# Patient Record
Sex: Male | Born: 1995 | Hispanic: No | Marital: Single | Smoking: Never smoker
Health system: Southern US, Community
[De-identification: ages and names within clinical notes are randomized; demographics above are authoritative.]

## PROBLEM LIST (undated history)

## (undated) ENCOUNTER — Ambulatory Visit: Admission: EM | Payer: Managed Care, Other (non HMO) | Source: Home / Self Care

---

## 2018-06-09 ENCOUNTER — Ambulatory Visit (INDEPENDENT_AMBULATORY_CARE_PROVIDER_SITE_OTHER): Payer: Worker's Compensation

## 2018-06-09 ENCOUNTER — Encounter: Payer: Self-pay | Admitting: Emergency Medicine

## 2018-06-09 ENCOUNTER — Other Ambulatory Visit: Payer: Self-pay

## 2018-06-09 ENCOUNTER — Ambulatory Visit
Admission: EM | Admit: 2018-06-09 | Discharge: 2018-06-09 | Disposition: A | Discharge status: Home / Self Care | Payer: Worker's Compensation | Attending: Internal Medicine | Admitting: Internal Medicine

## 2018-06-09 DIAGNOSIS — S63501A Unspecified sprain of right wrist, initial encounter: Secondary | ICD-10-CM

## 2018-06-09 DIAGNOSIS — M25531 Pain in right wrist: Secondary | ICD-10-CM

## 2018-06-09 DIAGNOSIS — X500XXA Overexertion from strenuous movement or load, initial encounter: Secondary | ICD-10-CM | POA: Diagnosis not present

## 2018-06-09 NOTE — ED Provider Notes (Signed)
[facility]    CSN: [id number] Arrival date & time: 06/09/18  1957     History   Chief Complaint Chief Complaint  Patient presents with  . Wrist Injury    right (DOI 06/09/18)    HPI Shane Ortiz is a 22 y.o. male.   22 yo male c/o wrist pain starting today after lifting heavy water tank overhead.      History reviewed. No pertinent past medical history.  There are no active problems to display for this patient.   History reviewed. No pertinent surgical history.     Home Medications    Prior to Admission medications   Not on File    Family History Family History  Problem Relation Age of Onset  . Healthy Mother   . Diabetes Father     Social History Social History   Tobacco Use  . Smoking status: Never Smoker  . Smokeless tobacco: Never Used  Substance Use Topics  . Alcohol use: Never    Frequency: Never  . Drug use: Never     Allergies   Patient has no known allergies.   Review of Systems Review of Systems  Constitutional: Negative for chills and fever.  HENT: Negative for sore throat and tinnitus.   Eyes: Negative for redness.  Respiratory: Negative for cough and shortness of breath.   Cardiovascular: Negative for chest pain and palpitations.  Gastrointestinal: Negative for abdominal pain, diarrhea, nausea and vomiting.  Genitourinary: Negative for dysuria, frequency and urgency.  Musculoskeletal: Negative for myalgias.  Skin: Negative for rash.       No lesions  Neurological: Negative for weakness.  Hematological: Does not bruise/bleed easily.  Psychiatric/Behavioral: Negative for suicidal ideas.     Physical Exam Triage Vital Signs ED Triage Vitals  Enc Vitals Group     BP 06/09/18 2026 135/88     Pulse Rate 06/09/18 2026 90     Resp 06/09/18 2026 16     Temp 06/09/18 2026 98.7 F (37.1 C)     Temp Source 06/09/18 2026 Oral     SpO2 06/09/18 2026 100 %     Weight 06/09/18 2026 143 lb 6.4 oz (65 kg)     Height  06/09/18 2026 5\' 6"  (1.676 m)     Head Circumference --      Peak Flow --      Pain Score 06/09/18 2025 9     Pain Loc --      Pain Edu? --      Excl. in GC? --    No data found.  Updated Vital Signs BP 135/88 (BP Location: Left Arm)   Pulse 90   Temp 98.7 F (37.1 C) (Oral)   Resp 16   Ht 5\' 6"  (1.676 m)   Wt 143 lb 6.4 oz (65 kg)   SpO2 100%   BMI 23.15 kg/m   Visual Acuity Right Eye Distance:   Left Eye Distance:   Bilateral Distance:    Right Eye Near:   Left Eye Near:    Bilateral Near:     Physical Exam  Constitutional: He is oriented to person, place, and time. He appears well-developed and well-nourished. No distress.  HENT:  Head: Normocephalic and atraumatic.  Mouth/Throat: Oropharynx is clear and moist.  Eyes: Pupils are equal, round, and reactive to light. Conjunctivae and EOM are normal. No scleral icterus.  Neck: Normal range of motion. Neck supple. No JVD present. No tracheal deviation present. No thyromegaly present.  Cardiovascular: Normal rate, regular rhythm and normal heart sounds. Exam reveals no gallop and no friction rub.  No murmur heard. Pulmonary/Chest: Effort normal and breath sounds normal. No respiratory distress.  Abdominal: Soft. Bowel sounds are normal. He exhibits no distension. There is no tenderness.  Musculoskeletal: Normal range of motion. He exhibits tenderness (mild; circumferential no point pain). He exhibits no edema or deformity.  Lymphadenopathy:    He has no cervical adenopathy.  Neurological: He is alert and oriented to person, place, and time. No cranial nerve deficit.  Skin: Skin is warm and dry. No rash noted. No erythema.  Psychiatric: He has a normal mood and affect. His behavior is normal. Judgment and thought content normal.     UC Treatments / Results  Labs (all labs ordered are listed, but only abnormal results are displayed) Labs Reviewed - No data to display  EKG None  Radiology Dg Wrist Complete  Right  Result Date: 06/09/2018 CLINICAL DATA:  Patient in today stating that he was trying to lift something heavy at work and twisted his right wrist. EXAM: RIGHT WRIST - COMPLETE 3+ VIEW COMPARISON:  None. FINDINGS: There is no evidence of fracture or dislocation. There is no evidence of arthropathy or other focal bone abnormality. Soft tissues are unremarkable. IMPRESSION: Negative. Electronically Signed   By: Bary Richard M.D.   On: 06/09/2018 20:46    Procedures Procedures (including critical care time)  Medications Ordered in UC Medications - No data to display  Initial Impression / Assessment and Plan / UC Course  I have reviewed the triage vital signs and the nursing notes.  Pertinent labs & imaging results that were available during my care of the patient were reviewed by me and considered in my medical decision making (see chart for details).     No fx on xray. Splint wrist  Final Clinical Impressions(s) / UC Diagnoses   Final diagnoses:  Right wrist sprain, initial encounter   Discharge Instructions   None    ED Prescriptions    None     Controlled Substance Prescriptions [place] Controlled Substance Registry consulted? Not Applicable   Arnaldo Natal, MD 06/09/18 2113

## 2018-06-09 NOTE — ED Triage Notes (Signed)
Patient in today stating that he was trying to lift something heavy at work and twisted his right wrist.

## 2019-08-16 IMAGING — CR DG WRIST COMPLETE 3+V*R*
4 series · 4 of 4 positions shown · non-contrast
Comparison: None.

CLINICAL DATA: Patient in today stating that he was trying to lift
something heavy at work and twisted his right wrist.

EXAM:
RIGHT WRIST - COMPLETE 3+ VIEW

[wrist pa]
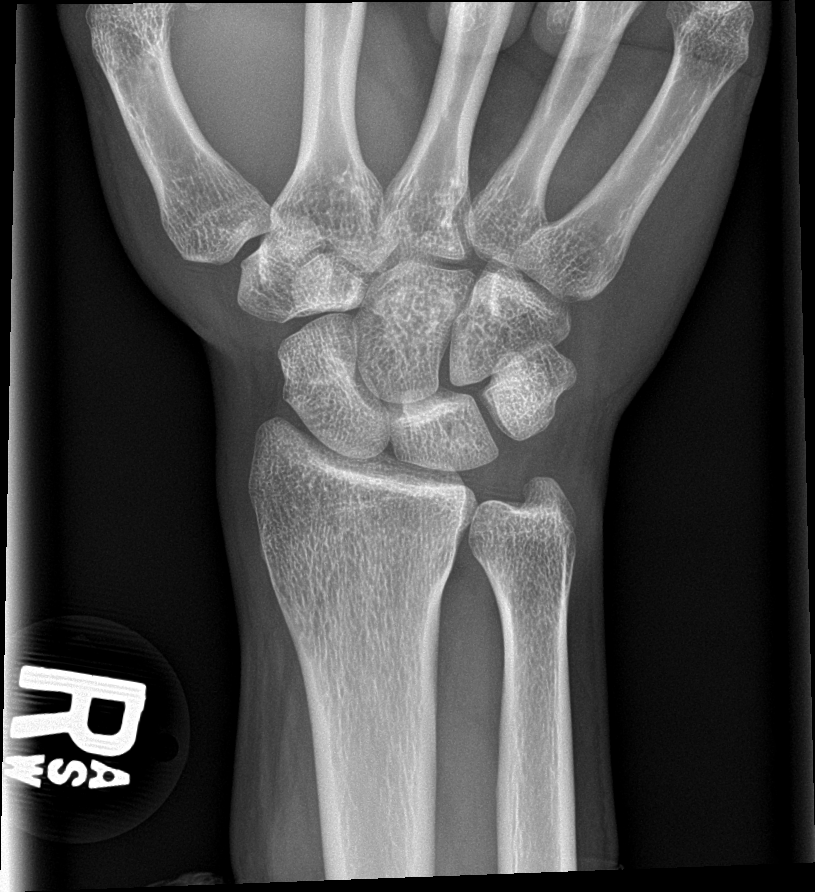

[wrist obl]
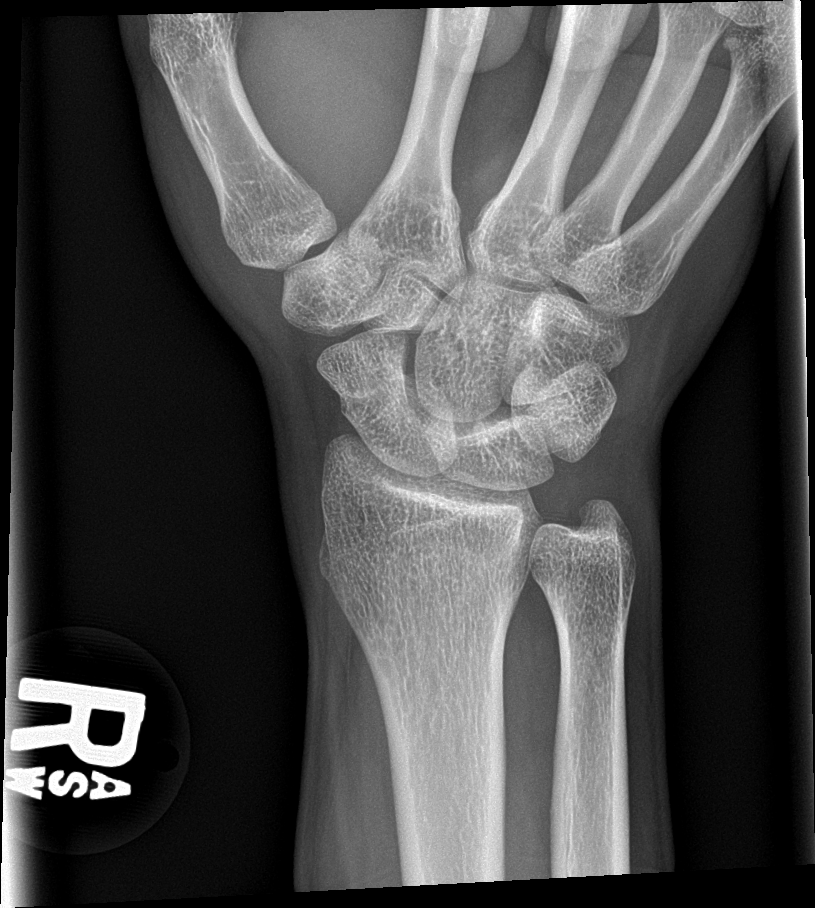

[wrist lat]
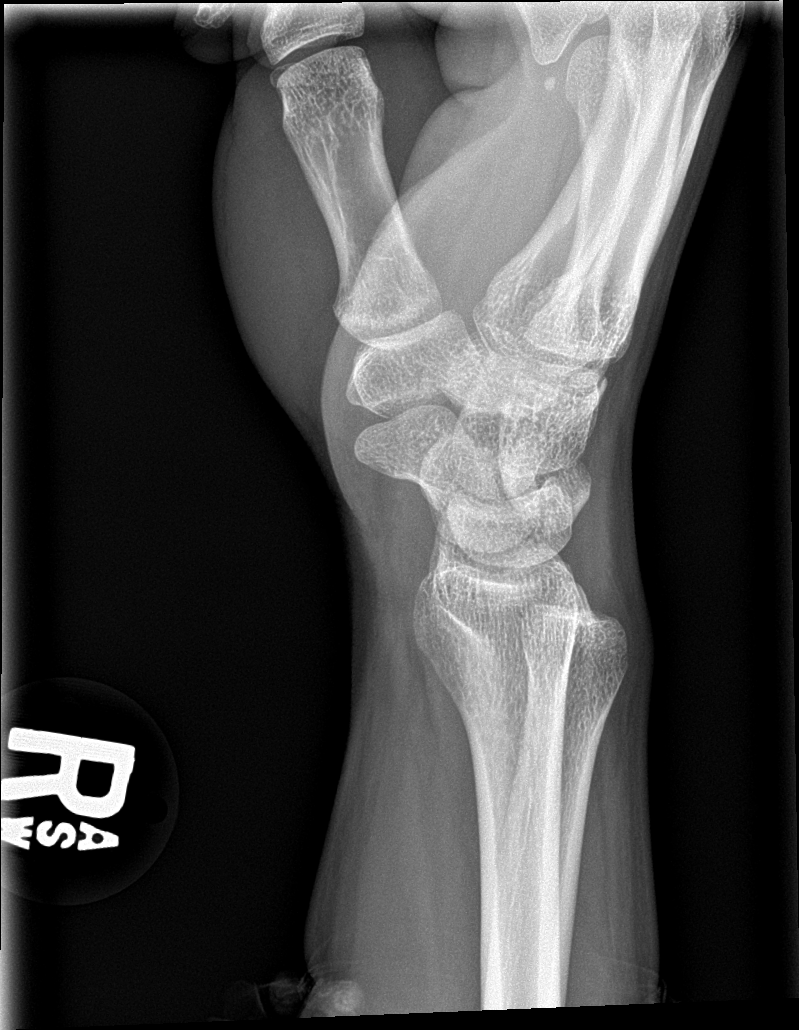

[wrist navicular]
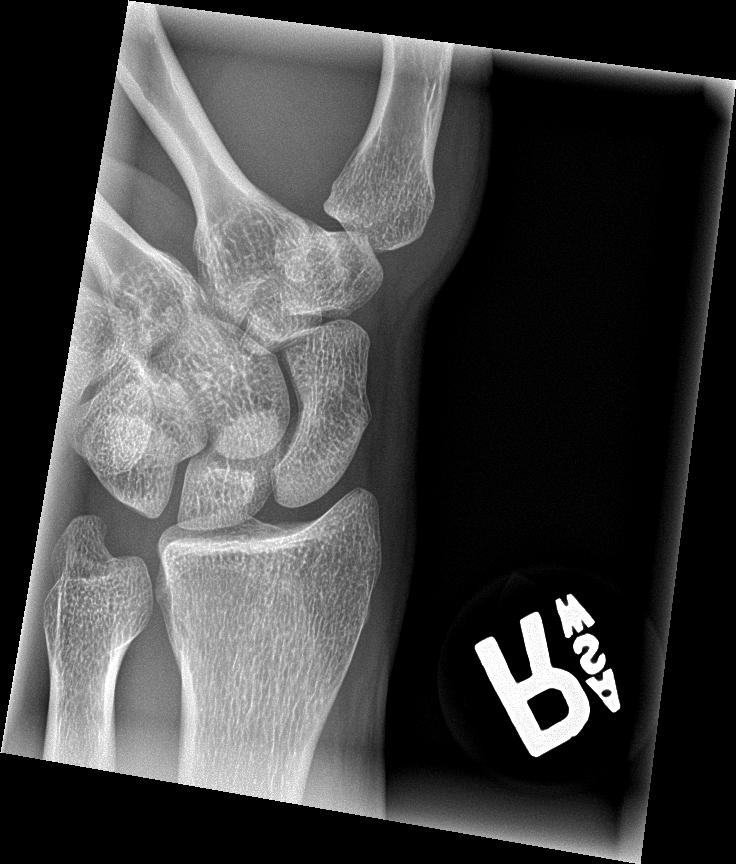

[4 of 4 positions shown; findings below may reference images not displayed]

FINDINGS: There is no evidence of fracture or dislocation. There is no
evidence of arthropathy or other focal bone abnormality. Soft
tissues are unremarkable.
IMPRESSION: Negative.

## 2021-10-12 ENCOUNTER — Ambulatory Visit
Admission: EM | Admit: 2021-10-12 | Discharge: 2021-10-12 | Disposition: A | Discharge status: Home / Self Care | Payer: Managed Care, Other (non HMO) | Attending: Internal Medicine | Admitting: Internal Medicine

## 2021-10-12 ENCOUNTER — Other Ambulatory Visit: Payer: Self-pay

## 2021-10-12 ENCOUNTER — Encounter: Payer: Self-pay | Admitting: Emergency Medicine

## 2021-10-12 DIAGNOSIS — K0889 Other specified disorders of teeth and supporting structures: Secondary | ICD-10-CM

## 2021-10-12 MED ORDER — IBUPROFEN 800 MG PO TABS: 800.0000 mg | ORAL_TABLET | Freq: Three times a day (TID) | ORAL | 0 refills | Status: DC

## 2021-10-12 MED ORDER — ACETAMINOPHEN 500 MG PO TABS
1000.0000 mg | ORAL_TABLET | Freq: Once | ORAL | Status: AC
  Administered 2021-10-12: 1000 mg via ORAL

## 2021-10-12 MED ORDER — PENICILLIN V POTASSIUM 500 MG PO TABS: 500.0000 mg | ORAL_TABLET | Freq: Four times a day (QID) | ORAL | 0 refills | Status: AC

## 2021-10-12 NOTE — Discharge Instructions (Addendum)
Alternate Tylenol with Ibuprofen like discussed.

## 2021-10-12 NOTE — ED Provider Notes (Signed)
[facility]    CSN: [id number] Arrival date & time: 10/12/21  1144      History   Chief Complaint Chief Complaint  Patient presents with   Dental Pain    HPI Shane Ortiz is a 25 y.o. male who presents with severe L upper molar pain which started last week but was not bad and lasted for a few days.Has a chipped molar where the pain is. Today in the middle of work felt the pain, and has not taken anything for this. He is supposed to see an oral surgeon for tooth extraction, but does not have an appointment yet. Took one dose of Amoxicillin from [place] his friend had today.    History reviewed. No pertinent past medical history.  There are no problems to display for this patient.   History reviewed. No pertinent surgical history.     Home Medications    Prior to Admission medications   Medication Sig Start Date End Date Taking? Authorizing Provider  ibuprofen (ADVIL) 800 MG tablet Take 1 tablet (800 mg total) by mouth 3 (three) times daily. 10/12/21  Yes Rodriguez-Southworth, Nettie Elm, PA-C  penicillin v potassium (VEETID) 500 MG tablet Take 1 tablet (500 mg total) by mouth 4 (four) times daily for 10 days. 10/12/21 10/22/21 Yes Rodriguez-Southworth, Nettie Elm, PA-C    Family History Family History  Problem Relation Age of Onset   Healthy Mother    Diabetes Father     Social History Social History   Tobacco Use   Smoking status: Never   Smokeless tobacco: Never  Vaping Use   Vaping Use: Never used  Substance Use Topics   Alcohol use: Never   Drug use: Never     Allergies   Patient has no known allergies.   Review of Systems Review of Systems  Constitutional:  Negative for fever.  HENT:  Positive for dental problem. Negative for facial swelling.   Musculoskeletal:  Negative for myalgias.  Neurological:  Negative for headaches.    Physical Exam Triage Vital Signs ED Triage Vitals  Enc Vitals Group     BP 10/12/21 1203 (!) 133/92     Pulse  Rate 10/12/21 1203 95     Resp 10/12/21 1203 16     Temp 10/12/21 1203 98.7 F (37.1 C)     Temp Source 10/12/21 1203 Oral     SpO2 10/12/21 1203 100 %     Weight 10/12/21 1201 143 lb 4.8 oz (65 kg)     Height 10/12/21 1201 5\' 7"  (1.702 m)     Head Circumference --      Peak Flow --      Pain Score 10/12/21 1201 5     Pain Loc --      Pain Edu? --      Excl. in GC? --    No data found.  Updated Vital Signs BP (!) 133/92 (BP Location: Left Arm)   Pulse 95   Temp 98.7 F (37.1 C) (Oral)   Resp 16   Ht 5\' 7"  (1.702 m)   Wt 143 lb 4.8 oz (65 kg)   SpO2 100%   BMI 22.44 kg/m   Visual Acuity Right Eye Distance:   Left Eye Distance:   Bilateral Distance:    Right Eye Near:   Left Eye Near:    Bilateral Near:     Physical Exam Vitals and nursing note reviewed.  Constitutional:      General: He is in acute distress.  Appearance: He is not toxic-appearing.     Comments: Who is in moderate pain  HENT:     Right Ear: External ear normal.     Left Ear: External ear normal.     Mouth/Throat:      Comments: Has chipped left upper posterior molar which is bleeding and the gum around it is swollen and red. I do not see an abscess.  Eyes:     General: No scleral icterus.    Conjunctiva/sclera: Conjunctivae normal.  Pulmonary:     Effort: Pulmonary effort is normal.  Musculoskeletal:        General: Normal range of motion.     Cervical back: Neck supple.  Lymphadenopathy:     Cervical: No cervical adenopathy.  Skin:    General: Skin is warm and dry.  Neurological:     Mental Status: He is alert.  Psychiatric:        Mood and Affect: Mood normal.        Behavior: Behavior normal.        Thought Content: Thought content normal.        Judgment: Judgment normal.     UC Treatments / Results  Labs (all labs ordered are listed, but only abnormal results are displayed) Labs Reviewed - No data to display  EKG   Radiology No results  found.  Procedures Procedures (including critical care time)  Medications Ordered in UC Medications  acetaminophen (TYLENOL) tablet 1,000 mg (1,000 mg Oral Given 10/12/21 1325)    Initial Impression / Assessment and Plan / UC Course  I have reviewed the triage vital signs and the nursing notes. Dental pain with possible infection He was given Tylenol 1000 mg PO here.  I placed him on Penicillin and Ibuprofen as noted. He was taught how to alternate the Tylenol q 6h with the Ibuprofen q 8h.  Final Clinical Impressions(s) / UC Diagnoses   Final diagnoses:  Pain, dental     Discharge Instructions      Alternate Tylenol with Ibuprofen like discussed.     ED Prescriptions     Medication Sig Dispense Auth. Provider   penicillin v potassium (VEETID) 500 MG tablet Take 1 tablet (500 mg total) by mouth 4 (four) times daily for 10 days. 40 tablet Rodriguez-Southworth, Nettie Elm, PA-C   ibuprofen (ADVIL) 800 MG tablet Take 1 tablet (800 mg total) by mouth 3 (three) times daily. 21 tablet Rodriguez-Southworth, Nettie Elm, PA-C      I have reviewed the PDMP during this encounter.   Garey Ham, PA-C 10/12/21 1332

## 2021-10-12 NOTE — ED Triage Notes (Signed)
Patient c/o left upper tooth pain that started 2 days ago.  Patient denies fevers.

## 2021-11-08 ENCOUNTER — Other Ambulatory Visit: Payer: Self-pay

## 2022-02-13 ENCOUNTER — Ambulatory Visit
Admission: EM | Admit: 2022-02-13 | Discharge: 2022-02-13 | Disposition: A | Discharge status: Home / Self Care | Payer: Managed Care, Other (non HMO) | Attending: Family | Admitting: Family

## 2022-02-13 DIAGNOSIS — H02823 Cysts of right eye, unspecified eyelid: Secondary | ICD-10-CM

## 2022-02-13 MED ORDER — CEPHALEXIN 500 MG PO CAPS: 500.0000 mg | ORAL_CAPSULE | Freq: Three times a day (TID) | ORAL | 0 refills | Status: AC

## 2022-02-13 NOTE — ED Triage Notes (Signed)
Pt reports having a bump in the right upper eyelid x 3 weeks; blurry vision started today.Denies pain. ?

## 2022-02-13 NOTE — Discharge Instructions (Addendum)
Recommend start Keflex antibiotic 500mg  capsule 3 times a day with food for 7 days. May apply warm compresses to area at least 2 times a day. Follow-up in 5 to 7 days if not improving or sooner if worsening.  ?

## 2022-02-13 NOTE — ED Provider Notes (Signed)
?[facility] ? ? ? ?CSN: [id number] ?Arrival date & time: 02/13/22  1715 ? ? ?  ? ?History   ?Chief Complaint ?Chief Complaint  ?Patient presents with  ? Eye Problem  ? ? ?HPI ?Shane ClaymanBrian Pompei is a 26 y.o. male.  ? ?26 year old male presents with "bump" on his right upper mid eyelid for the past 3 weeks. Has slowly been getting larger and today has caused his eye to tear and slight blurred vision. Denies any distinct pain or redness in area. No lesions or bumps on left eyelid. Denies any fever, nasal congestion, runny nose or cough. Has not taken any medication or applied any topical medication to area. Has history of previous stye but no other lesions. No other chronic health issues. Takes no daily medication.  ? ?The history is provided by the patient.  ? ?History reviewed. No pertinent past medical history. ? ?There are no problems to display for this patient. ? ? ?History reviewed. No pertinent surgical history. ? ? ? ? ?Home Medications   ? ?Prior to Admission medications   ?Medication Sig Start Date End Date Taking? Authorizing Provider  ?cephALEXin (KEFLEX) 500 MG capsule Take 1 capsule (500 mg total) by mouth 3 (three) times daily for 7 days. 02/13/22 02/20/22 Yes Shanaye Rief, Ali LoweAnn Berry, NP  ? ? ?Family History ?Family History  ?Problem Relation Age of Onset  ? Healthy Mother   ? Diabetes Father   ? ? ?Social History ?Social History  ? ?Tobacco Use  ? Smoking status: Never  ? Smokeless tobacco: Never  ?Vaping Use  ? Vaping Use: Never used  ?Substance Use Topics  ? Alcohol use: Never  ? Drug use: Never  ? ? ? ?Allergies   ?Patient has no known allergies. ? ? ?Review of Systems ?Review of Systems  ?Constitutional:  Negative for activity change, appetite change, chills, diaphoresis, fatigue and fever.  ?HENT:  Positive for facial swelling (right upper eyelid). Negative for congestion, ear discharge, ear pain, mouth sores, postnasal drip, rhinorrhea, sinus pressure, sinus pain, sneezing, sore throat and trouble  swallowing.   ?Eyes:  Positive for discharge (clear- right eye) and visual disturbance (slight blurred vision). Negative for photophobia, pain, redness and itching.  ?Respiratory:  Negative for cough and chest tightness.   ?Gastrointestinal:  Negative for nausea and vomiting.  ?Musculoskeletal:  Negative for arthralgias, myalgias, neck pain and neck stiffness.  ?Skin:  Negative for color change and rash.  ?Allergic/Immunologic: Negative for environmental allergies, food allergies and immunocompromised state.  ?Neurological:  Negative for dizziness, tremors, seizures, syncope, weakness, light-headedness, numbness and headaches.  ?Hematological:  Negative for adenopathy. Does not bruise/bleed easily.  ? ? ?Physical Exam ?Triage Vital Signs ?ED Triage Vitals  ?Enc Vitals Group  ?   BP 02/13/22 1824 129/67  ?   Pulse Rate 02/13/22 1824 90  ?   Resp 02/13/22 1824 16  ?   Temp 02/13/22 1824 98.5 ?F (36.9 ?C)  ?   Temp Source 02/13/22 1824 Oral  ?   SpO2 02/13/22 1824 99 %  ?   Weight --   ?   Height --   ?   Head Circumference --   ?   Peak Flow --   ?   Pain Score 02/13/22 1828 0  ?   Pain Loc --   ?   Pain Edu? --   ?   Excl. in GC? --   ? ?No data found. ? ?Updated Vital Signs ?BP 129/67 (  BP Location: Left Arm)   Pulse 90   Temp 98.5 ?F (36.9 ?C) (Oral)   Resp 16   SpO2 99%  ? ?Visual Acuity ?Right Eye Distance: 20/30 (Without correction) ?Left Eye Distance: 20/25 (Without correction) ?Bilateral Distance: 20/25 (Without correction) ? ?Right Eye Near:   ?Left Eye Near:    ?Bilateral Near:    ? ?Physical Exam ?Vitals and nursing note reviewed.  ?Constitutional:   ?   General: He is awake. He is not in acute distress. ?   Appearance: He is well-developed and well-groomed.  ?   Comments: He is sitting on the exam table in no acute distress. Denies any pain.   ?HENT:  ?   Head: Normocephalic and atraumatic.  ?   Right Ear: Hearing, tympanic membrane, ear canal and external ear normal.  ?   Left Ear: Hearing, tympanic  membrane, ear canal and external ear normal.  ?   Nose: Nose normal.  ?   Right Sinus: No maxillary sinus tenderness or frontal sinus tenderness.  ?   Left Sinus: No maxillary sinus tenderness or frontal sinus tenderness.  ?   Mouth/Throat:  ?   Lips: Pink.  ?   Mouth: Mucous membranes are moist.  ?   Pharynx: Oropharynx is clear.  ?Eyes:  ?   General: Vision grossly intact. No visual field deficit.    ?   Right eye: Discharge (clear) present. No hordeolum.     ?   Left eye: No discharge or hordeolum.  ?   Extraocular Movements: Extraocular movements intact.  ?   Conjunctiva/sclera: Conjunctivae normal.  ?   Pupils: Pupils are equal, round, and reactive to light.  ? ?   Comments: 2 to 37mm round raised cyst soft fluid-filled freely mobile cyst present on right upper central eyelid. Non-tender. No discharge expressed. Conjunctiva is normal. No redness. Slight clear discharge present. Inverted right upper eyelid- cyst does not extend into right upper conjunctiva. No distinct stye present. No bleeding.   ?Cardiovascular:  ?   Rate and Rhythm: Normal rate.  ?Pulmonary:  ?   Effort: Pulmonary effort is normal.  ?Musculoskeletal:  ?   Cervical back: Normal range of motion and neck supple.  ?Lymphadenopathy:  ?   Cervical: No cervical adenopathy.  ?Skin: ?   General: Skin is warm and dry.  ?   Findings: Lesion present. No bruising, erythema or rash.  ?Neurological:  ?   General: No focal deficit present.  ?   Mental Status: He is alert and oriented to person, place, and time.  ?Psychiatric:     ?   Attention and Perception: Attention normal.     ?   Mood and Affect: Mood is anxious.     ?   Speech: Speech normal.     ?   Behavior: Behavior is cooperative.     ?   Thought Content: Thought content normal.  ? ? ?UC Treatments / Results  ?Labs ?(all labs ordered are listed, but only abnormal results are displayed) ?Labs Reviewed - No data to display ? ?EKG ? ? ?Radiology ?No results found. ? ?Procedures ?Procedures (including  critical care time) ? ?Medications Ordered in UC ?Medications - No data to display ? ?Initial Impression / Assessment and Plan / UC Course  ?I have reviewed the triage vital signs and the nursing notes. ? ?Pertinent labs & imaging results that were available during my care of the patient were reviewed by me and considered in my  medical decision making (see chart for details). ? ?  ? ?Discussed with patient that he appears to have a cyst on his right upper eyelid in epidermal space. Since it is getting larger, will treat for possible bacterial etiology. May start Keflex 500mg  3 times a day with food. Apply warm compresses to area for comfort. Reviewed that tearing of right eye and slight blurry vision may be due to pressure from cyst and continue to monitor. Discussed that he may need an Eye doctor or Dermatologist to remove cyst if it persists. Patient had multiple questions about timing of antibiotics and food intake. Reviewed antibiotic dosing after eating a meal to minimize GI side effects. Recommend follow-up here in 5 to 7 days if not improving or sooner if worsening.  ?Final Clinical Impressions(s) / UC Diagnoses  ? ?Final diagnoses:  ?Cyst, eyelid, right  ? ? ? ?Discharge Instructions   ? ?  ?Recommend start Keflex antibiotic 500mg  capsule 3 times a day with food for 7 days. May apply warm compresses to area at least 2 times a day. Follow-up in 5 to 7 days if not improving or sooner if worsening.  ? ? ? ?ED Prescriptions   ? ? Medication Sig Dispense Auth. Provider  ? cephALEXin (KEFLEX) 500 MG capsule Take 1 capsule (500 mg total) by mouth 3 (three) times daily for 7 days. 21 capsule , NP  ? ?  ? ?PDMP not reviewed this encounter. ?  ? , NP ?02/14/22 1056 ? ?

## 2022-05-03 DIAGNOSIS — M25519 Pain in unspecified shoulder: Secondary | ICD-10-CM | POA: Diagnosis not present

## 2022-05-03 DIAGNOSIS — Z23 Encounter for immunization: Secondary | ICD-10-CM | POA: Diagnosis not present

## 2022-05-03 DIAGNOSIS — S299XXA Unspecified injury of thorax, initial encounter: Secondary | ICD-10-CM | POA: Diagnosis not present

## 2022-05-03 DIAGNOSIS — R52 Pain, unspecified: Secondary | ICD-10-CM | POA: Diagnosis not present

## 2022-05-03 DIAGNOSIS — M79645 Pain in left finger(s): Secondary | ICD-10-CM | POA: Diagnosis not present

## 2022-05-03 DIAGNOSIS — M25561 Pain in right knee: Secondary | ICD-10-CM | POA: Diagnosis not present

## 2022-05-03 DIAGNOSIS — M79642 Pain in left hand: Secondary | ICD-10-CM | POA: Diagnosis not present

## 2022-05-03 DIAGNOSIS — M25511 Pain in right shoulder: Secondary | ICD-10-CM | POA: Diagnosis not present

## 2022-05-03 DIAGNOSIS — S80812A Abrasion, left lower leg, initial encounter: Secondary | ICD-10-CM | POA: Diagnosis not present

## 2022-05-03 DIAGNOSIS — S99922A Unspecified injury of left foot, initial encounter: Secondary | ICD-10-CM | POA: Diagnosis not present

## 2022-05-03 DIAGNOSIS — Y33XXXA Other specified events, undetermined intent, initial encounter: Secondary | ICD-10-CM | POA: Diagnosis not present

## 2022-05-03 DIAGNOSIS — S99912A Unspecified injury of left ankle, initial encounter: Secondary | ICD-10-CM | POA: Diagnosis not present

## 2022-05-03 DIAGNOSIS — S80811A Abrasion, right lower leg, initial encounter: Secondary | ICD-10-CM | POA: Diagnosis not present

## 2022-05-03 DIAGNOSIS — S8992XA Unspecified injury of left lower leg, initial encounter: Secondary | ICD-10-CM | POA: Diagnosis not present

## 2022-05-03 DIAGNOSIS — M25572 Pain in left ankle and joints of left foot: Secondary | ICD-10-CM | POA: Diagnosis not present

## 2022-05-03 DIAGNOSIS — W2210XA Striking against or struck by unspecified automobile airbag, initial encounter: Secondary | ICD-10-CM | POA: Diagnosis not present

## 2022-05-09 ENCOUNTER — Encounter: Payer: Self-pay | Admitting: Emergency Medicine

## 2022-05-09 ENCOUNTER — Ambulatory Visit
Admission: EM | Admit: 2022-05-09 | Discharge: 2022-05-09 | Disposition: A | Discharge status: Home / Self Care | Payer: BC Managed Care – PPO | Attending: Emergency Medicine | Admitting: Emergency Medicine

## 2022-05-09 DIAGNOSIS — M25511 Pain in right shoulder: Secondary | ICD-10-CM | POA: Diagnosis not present

## 2022-05-09 DIAGNOSIS — S81812A Laceration without foreign body, left lower leg, initial encounter: Secondary | ICD-10-CM | POA: Diagnosis not present

## 2022-05-09 MED ORDER — CEPHALEXIN 500 MG PO CAPS: 1000.0000 mg | ORAL_CAPSULE | Freq: Two times a day (BID) | ORAL | 0 refills | Status: AC

## 2022-05-09 MED ORDER — METHOCARBAMOL 500 MG PO TABS: 500.0000 mg | ORAL_TABLET | Freq: Four times a day (QID) | ORAL | 0 refills | Status: AC

## 2022-05-19 ENCOUNTER — Ambulatory Visit (INDEPENDENT_AMBULATORY_CARE_PROVIDER_SITE_OTHER): Payer: BC Managed Care – PPO | Admitting: Family Medicine

## 2022-05-19 ENCOUNTER — Ambulatory Visit
Admission: EM | Admit: 2022-05-19 | Discharge: 2022-05-19 | Disposition: A | Discharge status: Home / Self Care | Payer: BC Managed Care – PPO

## 2022-05-19 ENCOUNTER — Encounter: Payer: Self-pay | Admitting: Family Medicine

## 2022-05-19 VITALS — BP 122/82 | HR 74 | Ht 67.0 in | Wt 160.0 lb

## 2022-05-19 DIAGNOSIS — M25511 Pain in right shoulder: Secondary | ICD-10-CM | POA: Diagnosis not present

## 2022-05-19 DIAGNOSIS — S46001A Unspecified injury of muscle(s) and tendon(s) of the rotator cuff of right shoulder, initial encounter: Secondary | ICD-10-CM | POA: Diagnosis not present

## 2022-05-19 DIAGNOSIS — M25572 Pain in left ankle and joints of left foot: Secondary | ICD-10-CM

## 2022-05-19 DIAGNOSIS — M76822 Posterior tibial tendinitis, left leg: Secondary | ICD-10-CM

## 2022-05-19 MED ORDER — PREDNISONE 50 MG PO TABS: 50.0000 mg | ORAL_TABLET | Freq: Every day | ORAL | 0 refills | Status: DC

## 2022-05-19 MED ORDER — MELOXICAM 15 MG PO TABS: 15.0000 mg | ORAL_TABLET | Freq: Every day | ORAL | 0 refills | Status: DC
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
## 2022-05-19 NOTE — Assessment & Plan Note (Signed)
See additional assessment(s) for history details.  Examination reveals multiple abrasions in various states of healing without open lesions, range of motion is limited by stiffness though he is able to actively provide strength in all directions, tenderness along the posterior tibialis tendons at the ankle tracing towards the medial aspect of the foot, pain with resisted testing, negative talar tilt, negative drawer testing, negative external rotation.  Symptoms are most consistent with posterior tibialis tendinitis, acute and traumatic in nature, no overt laxity and deltoid remains intact.  Plan for scheduled prednisone, then meloxicam,.  Methocarbamol, formal PT, he can continue to use a lace up ASO brace, and maintain close follow-up in 3 weeks.  Out of work restrictions provided.

## 2022-05-19 NOTE — Patient Instructions (Signed)
-   Take prednisone for full course - After prednisone complete, take meloxicam once daily - Use methocarbamol every 6 hours as-needed for muscle pain - Can use Tylenol, ice, heat for additional pain control - Start physical therapy, contact below for scheduling: [facility]:  [place]:  [phone]  - Remain out of work until follow-up - Return in 3 weeks
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
## 2022-05-19 NOTE — Discharge Instructions (Addendum)
-  I have gotten an appoint with Dr. Ashley Royalty today.  [facility] at [facility], [address], [address]. Phone number: [phone] Dr. Joseph Berkshire. Appointment at 4 pm. Arrive 15 min early.  -You have continued pain but your range of motion is significantly improved.  You may need further restrictions or possibly referral to physical therapy, potentially more imaging of your shoulder, and to be eased back into work.  Dr. Ashley Royalty to be better suited to help you with this then we can in urgent care.

## 2022-05-22 ENCOUNTER — Other Ambulatory Visit: Payer: Self-pay | Admitting: Family Medicine

## 2022-05-22 ENCOUNTER — Telehealth: Payer: Self-pay

## 2022-05-22 ENCOUNTER — Ambulatory Visit: Payer: BC Managed Care – PPO | Attending: Family Medicine | Admitting: Physical Therapy

## 2022-05-22 DIAGNOSIS — M6281 Muscle weakness (generalized): Secondary | ICD-10-CM | POA: Insufficient documentation

## 2022-05-22 DIAGNOSIS — S46001A Unspecified injury of muscle(s) and tendon(s) of the rotator cuff of right shoulder, initial encounter: Secondary | ICD-10-CM | POA: Insufficient documentation

## 2022-05-22 DIAGNOSIS — M25572 Pain in left ankle and joints of left foot: Secondary | ICD-10-CM | POA: Diagnosis not present

## 2022-05-22 DIAGNOSIS — R262 Difficulty in walking, not elsewhere classified: Secondary | ICD-10-CM | POA: Insufficient documentation

## 2022-05-22 DIAGNOSIS — M76822 Posterior tibial tendinitis, left leg: Secondary | ICD-10-CM | POA: Diagnosis not present

## 2022-05-22 DIAGNOSIS — M25511 Pain in right shoulder: Secondary | ICD-10-CM | POA: Diagnosis not present

## 2022-05-22 NOTE — Telephone Encounter (Signed)
Copied from CRM [id number]. Topic: General - Inquiry >> May 22, 2022  7:53 AM De Blanch wrote: Reason for CRM: Rayfield Citizen from [facility] stated pt went in this morning for PT, which was sent by Dr. Joseph Berkshire; however, they have no orders.  Please advise.  Call back - [phone]  Fax- [phone]
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
## 2022-05-22 NOTE — Telephone Encounter (Signed)
Dr Ashley Royalty put PT order in 05/22/2022- Silva Bandy

## 2022-05-25 ENCOUNTER — Encounter: Payer: Self-pay | Admitting: Physical Therapy

## 2022-05-26 ENCOUNTER — Ambulatory Visit: Payer: BC Managed Care – PPO | Admitting: Physical Therapy

## 2022-05-26 DIAGNOSIS — M76822 Posterior tibial tendinitis, left leg: Secondary | ICD-10-CM | POA: Diagnosis not present

## 2022-05-26 DIAGNOSIS — S46001A Unspecified injury of muscle(s) and tendon(s) of the rotator cuff of right shoulder, initial encounter: Secondary | ICD-10-CM | POA: Diagnosis not present

## 2022-05-26 DIAGNOSIS — R262 Difficulty in walking, not elsewhere classified: Secondary | ICD-10-CM | POA: Diagnosis not present

## 2022-05-26 DIAGNOSIS — M6281 Muscle weakness (generalized): Secondary | ICD-10-CM | POA: Diagnosis not present

## 2022-05-26 DIAGNOSIS — M25572 Pain in left ankle and joints of left foot: Secondary | ICD-10-CM | POA: Diagnosis not present

## 2022-05-26 DIAGNOSIS — M25511 Pain in right shoulder: Secondary | ICD-10-CM | POA: Diagnosis not present
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
## 2022-05-28 ENCOUNTER — Encounter: Payer: Self-pay | Admitting: Physical Therapy

## 2022-05-28 ENCOUNTER — Ambulatory Visit: Payer: BC Managed Care – PPO | Admitting: Physical Therapy

## 2022-05-28 DIAGNOSIS — M6281 Muscle weakness (generalized): Secondary | ICD-10-CM | POA: Diagnosis not present

## 2022-05-28 DIAGNOSIS — S46001A Unspecified injury of muscle(s) and tendon(s) of the rotator cuff of right shoulder, initial encounter: Secondary | ICD-10-CM | POA: Diagnosis not present

## 2022-05-28 DIAGNOSIS — M25572 Pain in left ankle and joints of left foot: Secondary | ICD-10-CM

## 2022-05-28 DIAGNOSIS — R262 Difficulty in walking, not elsewhere classified: Secondary | ICD-10-CM

## 2022-05-28 DIAGNOSIS — M25511 Pain in right shoulder: Secondary | ICD-10-CM

## 2022-05-28 DIAGNOSIS — M76822 Posterior tibial tendinitis, left leg: Secondary | ICD-10-CM | POA: Diagnosis not present
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
## 2022-06-02 ENCOUNTER — Ambulatory Visit: Payer: BC Managed Care – PPO | Admitting: Physical Therapy

## 2022-06-02 ENCOUNTER — Encounter: Payer: Self-pay | Admitting: Physical Therapy

## 2022-06-02 DIAGNOSIS — S46001A Unspecified injury of muscle(s) and tendon(s) of the rotator cuff of right shoulder, initial encounter: Secondary | ICD-10-CM | POA: Diagnosis not present

## 2022-06-02 DIAGNOSIS — M76822 Posterior tibial tendinitis, left leg: Secondary | ICD-10-CM | POA: Diagnosis not present

## 2022-06-02 DIAGNOSIS — R262 Difficulty in walking, not elsewhere classified: Secondary | ICD-10-CM

## 2022-06-02 DIAGNOSIS — M25572 Pain in left ankle and joints of left foot: Secondary | ICD-10-CM | POA: Diagnosis not present

## 2022-06-02 DIAGNOSIS — M6281 Muscle weakness (generalized): Secondary | ICD-10-CM

## 2022-06-02 DIAGNOSIS — M25511 Pain in right shoulder: Secondary | ICD-10-CM | POA: Diagnosis not present
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
## 2022-06-04 ENCOUNTER — Encounter: Payer: Self-pay | Admitting: Physical Therapy

## 2022-06-04 ENCOUNTER — Ambulatory Visit: Payer: BC Managed Care – PPO | Admitting: Physical Therapy

## 2022-06-04 DIAGNOSIS — M6281 Muscle weakness (generalized): Secondary | ICD-10-CM | POA: Diagnosis not present

## 2022-06-04 DIAGNOSIS — S46001A Unspecified injury of muscle(s) and tendon(s) of the rotator cuff of right shoulder, initial encounter: Secondary | ICD-10-CM | POA: Diagnosis not present

## 2022-06-04 DIAGNOSIS — M76822 Posterior tibial tendinitis, left leg: Secondary | ICD-10-CM | POA: Diagnosis not present

## 2022-06-04 DIAGNOSIS — M25572 Pain in left ankle and joints of left foot: Secondary | ICD-10-CM | POA: Diagnosis not present

## 2022-06-04 DIAGNOSIS — M25511 Pain in right shoulder: Secondary | ICD-10-CM

## 2022-06-04 DIAGNOSIS — R262 Difficulty in walking, not elsewhere classified: Secondary | ICD-10-CM
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
## 2022-06-06 ENCOUNTER — Ambulatory Visit (INDEPENDENT_AMBULATORY_CARE_PROVIDER_SITE_OTHER): Payer: BC Managed Care – PPO | Admitting: Family Medicine

## 2022-06-06 ENCOUNTER — Encounter: Payer: Self-pay | Admitting: Family Medicine

## 2022-06-06 DIAGNOSIS — M76822 Posterior tibial tendinitis, left leg: Secondary | ICD-10-CM

## 2022-06-06 DIAGNOSIS — S46001D Unspecified injury of muscle(s) and tendon(s) of the rotator cuff of right shoulder, subsequent encounter: Secondary | ICD-10-CM

## 2022-06-06 NOTE — Progress Notes (Signed)
     Primary Care / Sports Medicine Office Visit  Patient Information:  Patient ID: Shane Ortiz, male DOB: Oct 18, 1996 Age: 26 y.o. MRN: [id number]   Shane Ortiz is a pleasant 26 y.o. male presenting with the following:  Chief Complaint  Patient presents with   Injury of tendon of right rotator cuff    Is still taking Meloxicam, finished prednisone. States he is feeling better today.     Vitals:   06/06/22 1013  BP: 120/80  Pulse: 88  SpO2: 98%   Vitals:   06/06/22 1013  Weight: 160 lb (72.6 kg)  Height: 5\' 7"  (1.702 m)   Body mass index is 25.06 kg/m.  No results found.   Independent interpretation of notes and tests performed by another provider:   None  Procedures performed:   None  Pertinent History, Exam, Impression, and Recommendations:   Problem List Items Addressed This Visit       Musculoskeletal and Integument   Injury of tendon of right rotator cuff    Patient is demonstrated excellent interval progress following prednisone, meloxicam, physical therapy, and out of work restrictions.  His examination today reveals full painless range of motion, 5/5 rotator cuff strength, negative impingement, negative Hawkins, negative speeds, negative Yergason's, negative O'Brien's, minimal discomfort with isolated supraspinatus testing.  Given his excellent findings today, I have advised continued remaining formal PT sessions, home-based rehab, as needed meloxicam, and return to work in 1 week.  He can follow-up on in 1 month to assess his symptoms after returning to work.      Posterior tibial tendinitis, left    Patient presents for follow-up to left posterior tibialis tendinitis in setting of trauma, since last visit he has noted improvement though not resolution of symptoms, did dose prednisone, meloxicam, participated with formal PT, and has been using a ASO brace with benefit.  Examination today reveals limited range of motion due to deconditioning/stiffness, pain with  isolated posterior tibialis stressing, secondary tenderness throughout the dorsiflexors at the anterior ankle and dorsum of the midfoot.  At this stage of advised patient to gradually wean and ultimately discontinue ASO, heavy focus on stabilization and proprioceptive training through PT, continue meloxicam as needed, and return to work in 1 week.  When he returns to work he is to use his brace for 1-2 weeks, restart meloxicam for the same duration, and work restrictions advised to be at work note.  Return for follow-up in 1 month to assess symptoms following return to work.        Orders & Medications No orders of the defined types were placed in this encounter.  No orders of the defined types were placed in this encounter.    Return in about 4 weeks (around 07/04/2022).     07/06/2022, MD   Primary Care Sports Medicine [facility]

## 2022-06-06 NOTE — Assessment & Plan Note (Signed)
Patient is demonstrated excellent interval progress following prednisone, meloxicam, physical therapy, and out of work restrictions.  His examination today reveals full painless range of motion, 5/5 rotator cuff strength, negative impingement, negative Hawkins, negative speeds, negative Yergason's, negative O'Brien's, minimal discomfort with isolated supraspinatus testing.  Given his excellent findings today, I have advised continued remaining formal PT sessions, home-based rehab, as needed meloxicam, and return to work in 1 week.  He can follow-up on in 1 month to assess his symptoms after returning to work.

## 2022-06-06 NOTE — Assessment & Plan Note (Signed)
Patient presents for follow-up to left posterior tibialis tendinitis in setting of trauma, since last visit he has noted improvement though not resolution of symptoms, did dose prednisone, meloxicam, participated with formal PT, and has been using a ASO brace with benefit.  Examination today reveals limited range of motion due to deconditioning/stiffness, pain with isolated posterior tibialis stressing, secondary tenderness throughout the dorsiflexors at the anterior ankle and dorsum of the midfoot.  At this stage of advised patient to gradually wean and ultimately discontinue ASO, heavy focus on stabilization and proprioceptive training through PT, continue meloxicam as needed, and return to work in 1 week.  When he returns to work he is to use his brace for 1-2 weeks, restart meloxicam for the same duration, and work restrictions advised to be at work note.  Return for follow-up in 1 month to assess symptoms following return to work.

## 2022-06-06 NOTE — Patient Instructions (Signed)
-   Have physical therapy group focus 80-90% on your left foot/ankle including the following: Aggressive advance of foot/ankle stabilizers with focality to the posterior tibialis tendon, work on proprioception and gait retraining, work hardening - Transition meloxicam to once daily on an as-needed basis (however dose meloxicam once daily scheduled x1 week during her first week back at work) - Discontinue ankle brace while at home, once ankle/foot symptoms have improved at home, discontinue ankle brace while outside of the home - Return to work in 1 week, during her first week back at work to use ankle brace consistently for 1-2 weeks then as needed - Return for follow-up in 4 weeks

## 2022-06-09 ENCOUNTER — Ambulatory Visit: Payer: BC Managed Care – PPO | Admitting: Physical Therapy

## 2022-06-09 DIAGNOSIS — M25572 Pain in left ankle and joints of left foot: Secondary | ICD-10-CM | POA: Diagnosis not present

## 2022-06-09 DIAGNOSIS — M6281 Muscle weakness (generalized): Secondary | ICD-10-CM | POA: Diagnosis not present

## 2022-06-09 DIAGNOSIS — M25511 Pain in right shoulder: Secondary | ICD-10-CM | POA: Diagnosis not present

## 2022-06-09 DIAGNOSIS — M76822 Posterior tibial tendinitis, left leg: Secondary | ICD-10-CM | POA: Diagnosis not present

## 2022-06-09 DIAGNOSIS — S46001A Unspecified injury of muscle(s) and tendon(s) of the rotator cuff of right shoulder, initial encounter: Secondary | ICD-10-CM | POA: Diagnosis not present

## 2022-06-09 DIAGNOSIS — R262 Difficulty in walking, not elsewhere classified: Secondary | ICD-10-CM | POA: Diagnosis not present
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
## 2022-06-11 ENCOUNTER — Encounter: Payer: Self-pay | Admitting: Physical Therapy

## 2022-06-12 ENCOUNTER — Ambulatory Visit: Payer: BC Managed Care – PPO | Admitting: Physical Therapy

## 2022-06-12 DIAGNOSIS — M25572 Pain in left ankle and joints of left foot: Secondary | ICD-10-CM

## 2022-06-12 DIAGNOSIS — M25511 Pain in right shoulder: Secondary | ICD-10-CM

## 2022-06-12 DIAGNOSIS — M6281 Muscle weakness (generalized): Secondary | ICD-10-CM

## 2022-06-12 DIAGNOSIS — S46001A Unspecified injury of muscle(s) and tendon(s) of the rotator cuff of right shoulder, initial encounter: Secondary | ICD-10-CM | POA: Diagnosis not present

## 2022-06-12 DIAGNOSIS — M76822 Posterior tibial tendinitis, left leg: Secondary | ICD-10-CM | POA: Diagnosis not present

## 2022-06-12 DIAGNOSIS — R262 Difficulty in walking, not elsewhere classified: Secondary | ICD-10-CM
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
## 2022-06-14 ENCOUNTER — Encounter: Payer: Self-pay | Admitting: Physical Therapy

## 2022-06-16 ENCOUNTER — Encounter: Payer: Self-pay | Admitting: Physical Therapy

## 2022-06-16 ENCOUNTER — Ambulatory Visit: Payer: BC Managed Care – PPO | Admitting: Physical Therapy

## 2022-06-16 DIAGNOSIS — M76822 Posterior tibial tendinitis, left leg: Secondary | ICD-10-CM | POA: Diagnosis not present

## 2022-06-16 DIAGNOSIS — M25511 Pain in right shoulder: Secondary | ICD-10-CM

## 2022-06-16 DIAGNOSIS — R262 Difficulty in walking, not elsewhere classified: Secondary | ICD-10-CM | POA: Diagnosis not present

## 2022-06-16 DIAGNOSIS — M25572 Pain in left ankle and joints of left foot: Secondary | ICD-10-CM | POA: Diagnosis not present

## 2022-06-16 DIAGNOSIS — M6281 Muscle weakness (generalized): Secondary | ICD-10-CM | POA: Diagnosis not present

## 2022-06-16 DIAGNOSIS — S46001A Unspecified injury of muscle(s) and tendon(s) of the rotator cuff of right shoulder, initial encounter: Secondary | ICD-10-CM | POA: Diagnosis not present
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
## 2022-06-17 ENCOUNTER — Encounter: Payer: BC Managed Care – PPO | Admitting: Physical Therapy

## 2022-06-19 ENCOUNTER — Ambulatory Visit: Payer: BC Managed Care – PPO | Attending: Family Medicine | Admitting: Physical Therapy

## 2022-06-19 ENCOUNTER — Encounter: Payer: Self-pay | Admitting: Physical Therapy

## 2022-06-19 DIAGNOSIS — M25572 Pain in left ankle and joints of left foot: Secondary | ICD-10-CM | POA: Insufficient documentation

## 2022-06-19 DIAGNOSIS — R262 Difficulty in walking, not elsewhere classified: Secondary | ICD-10-CM | POA: Insufficient documentation

## 2022-06-19 DIAGNOSIS — M25511 Pain in right shoulder: Secondary | ICD-10-CM | POA: Diagnosis not present

## 2022-06-19 DIAGNOSIS — M6281 Muscle weakness (generalized): Secondary | ICD-10-CM | POA: Insufficient documentation
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
## 2022-06-23 ENCOUNTER — Ambulatory Visit: Payer: BC Managed Care – PPO | Admitting: Physical Therapy

## 2022-06-23 ENCOUNTER — Encounter: Payer: Self-pay | Admitting: Physical Therapy

## 2022-06-23 ENCOUNTER — Encounter: Payer: Self-pay | Admitting: Family Medicine

## 2022-06-23 DIAGNOSIS — R262 Difficulty in walking, not elsewhere classified: Secondary | ICD-10-CM | POA: Diagnosis not present

## 2022-06-23 DIAGNOSIS — M6281 Muscle weakness (generalized): Secondary | ICD-10-CM

## 2022-06-23 DIAGNOSIS — M25511 Pain in right shoulder: Secondary | ICD-10-CM | POA: Diagnosis not present

## 2022-06-23 DIAGNOSIS — M25572 Pain in left ankle and joints of left foot: Secondary | ICD-10-CM | POA: Diagnosis not present
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
## 2022-06-24 NOTE — Telephone Encounter (Signed)
Please advise 

## 2022-06-25 ENCOUNTER — Encounter: Payer: Self-pay | Admitting: Physical Therapy

## 2022-06-25 ENCOUNTER — Ambulatory Visit: Payer: BC Managed Care – PPO | Admitting: Physical Therapy

## 2022-06-25 DIAGNOSIS — M25511 Pain in right shoulder: Secondary | ICD-10-CM

## 2022-06-25 DIAGNOSIS — M25572 Pain in left ankle and joints of left foot: Secondary | ICD-10-CM | POA: Diagnosis not present

## 2022-06-25 DIAGNOSIS — M6281 Muscle weakness (generalized): Secondary | ICD-10-CM

## 2022-06-25 DIAGNOSIS — R262 Difficulty in walking, not elsewhere classified: Secondary | ICD-10-CM
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
## 2022-06-30 ENCOUNTER — Encounter: Payer: Self-pay | Admitting: Physical Therapy

## 2022-06-30 ENCOUNTER — Ambulatory Visit: Payer: BC Managed Care – PPO | Admitting: Physical Therapy

## 2022-06-30 DIAGNOSIS — M6281 Muscle weakness (generalized): Secondary | ICD-10-CM | POA: Diagnosis not present

## 2022-06-30 DIAGNOSIS — M25511 Pain in right shoulder: Secondary | ICD-10-CM

## 2022-06-30 DIAGNOSIS — R262 Difficulty in walking, not elsewhere classified: Secondary | ICD-10-CM

## 2022-06-30 DIAGNOSIS — M25572 Pain in left ankle and joints of left foot: Secondary | ICD-10-CM | POA: Diagnosis not present
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
## 2022-07-02 ENCOUNTER — Ambulatory Visit: Payer: BC Managed Care – PPO | Admitting: Physical Therapy

## 2022-07-02 DIAGNOSIS — M6281 Muscle weakness (generalized): Secondary | ICD-10-CM | POA: Diagnosis not present

## 2022-07-02 DIAGNOSIS — M25511 Pain in right shoulder: Secondary | ICD-10-CM

## 2022-07-02 DIAGNOSIS — R262 Difficulty in walking, not elsewhere classified: Secondary | ICD-10-CM

## 2022-07-02 DIAGNOSIS — M25572 Pain in left ankle and joints of left foot: Secondary | ICD-10-CM
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
## 2022-07-03 ENCOUNTER — Encounter: Payer: Self-pay | Admitting: Physical Therapy

## 2022-07-07 ENCOUNTER — Ambulatory Visit: Payer: BC Managed Care – PPO | Admitting: Physical Therapy

## 2022-07-07 ENCOUNTER — Encounter: Payer: Self-pay | Admitting: Physical Therapy

## 2022-07-07 DIAGNOSIS — M6281 Muscle weakness (generalized): Secondary | ICD-10-CM | POA: Diagnosis not present

## 2022-07-07 DIAGNOSIS — M25572 Pain in left ankle and joints of left foot: Secondary | ICD-10-CM | POA: Diagnosis not present

## 2022-07-07 DIAGNOSIS — M25511 Pain in right shoulder: Secondary | ICD-10-CM

## 2022-07-07 DIAGNOSIS — R262 Difficulty in walking, not elsewhere classified: Secondary | ICD-10-CM
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
## 2022-07-08 ENCOUNTER — Encounter: Payer: Self-pay | Admitting: Family Medicine

## 2022-07-08 ENCOUNTER — Ambulatory Visit (INDEPENDENT_AMBULATORY_CARE_PROVIDER_SITE_OTHER): Payer: BC Managed Care – PPO | Admitting: Family Medicine

## 2022-07-08 VITALS — BP 130/80 | HR 78 | Ht 67.0 in | Wt 163.6 lb

## 2022-07-08 DIAGNOSIS — M76822 Posterior tibial tendinitis, left leg: Secondary | ICD-10-CM

## 2022-07-08 DIAGNOSIS — S46001D Unspecified injury of muscle(s) and tendon(s) of the rotator cuff of right shoulder, subsequent encounter: Secondary | ICD-10-CM

## 2022-07-08 MED ORDER — DICLOFENAC SODIUM 50 MG PO TBEC: 50.0000 mg | DELAYED_RELEASE_TABLET | Freq: Two times a day (BID) | ORAL | 0 refills | Status: DC | PRN

## 2022-07-08 NOTE — Assessment & Plan Note (Signed)
Still symptomatic, interim PT notes reviewed, steady progress with pain still localizing to the medial left foot/ankle and provocative testing localizes to the posterior tibialis tendon at the region of the medial malleolus to its insertion.  That being said, patient has been utilizing ASO brace consistently outside of the home and this may be hindering his progress.  I have reviewed the same with the patient and have discussed full discontinuation of the brace with reserved usage sparingly.  From a medication management standpoint diclofenac twice daily as needed was written to bridge any pain/swelling during this brace discontinuation process.  Return in 6 weeks for reevaluation, will finish out PT and continue work with restrictions over interim.

## 2022-07-08 NOTE — Progress Notes (Signed)
     Primary Care / Sports Medicine Office Visit  Patient Information:  Patient ID: Shane Ortiz, male DOB: 10-04-1996 Age: 26 y.o. MRN: [id number]   Shane Ortiz is a pleasant 26 y.o. male presenting with the following:  Chief Complaint  Patient presents with   Injury of tendon of right rotator cuff, subsequent encounter    Vitals:   07/08/22 1345  BP: 130/80  Pulse: 78  SpO2: 98%   Vitals:   07/08/22 1345  Weight: 163 lb 9.6 oz (74.2 kg)  Height: 5\' 7"  (1.702 m)   Body mass index is 25.62 kg/m.  No results found.   Independent interpretation of notes and tests performed by another provider:   None  Procedures performed:   None  Pertinent History, Exam, Impression, and Recommendations:   Problem List Items Addressed This Visit       Musculoskeletal and Integument   Injury of tendon of right rotator cuff - Primary    Currently asymptomatic, encouraged continued activities as tolerated without restrictions.      Relevant Medications   diclofenac (VOLTAREN) 50 MG EC tablet   Posterior tibial tendinitis, left    Still symptomatic, interim PT notes reviewed, steady progress with pain still localizing to the medial left foot/ankle and provocative testing localizes to the posterior tibialis tendon at the region of the medial malleolus to its insertion.  That being said, patient has been utilizing ASO brace consistently outside of the home and this may be hindering his progress.  I have reviewed the same with the patient and have discussed full discontinuation of the brace with reserved usage sparingly.  From a medication management standpoint diclofenac twice daily as needed was written to bridge any pain/swelling during this brace discontinuation process.  Return in 6 weeks for reevaluation, will finish out PT and continue work with restrictions over interim.      Relevant Medications   diclofenac (VOLTAREN) 50 MG EC tablet     Orders & Medications Meds ordered this  encounter  Medications   diclofenac (VOLTAREN) 50 MG EC tablet    Sig: Take 1 tablet (50 mg total) by mouth 2 (two) times daily as needed (pain).    Dispense:  60 tablet    Refill:  0   No orders of the defined types were placed in this encounter.    Return in about 6 weeks (around 08/19/2022).     10/19/2022, MD   Primary Care Sports Medicine [facility]

## 2022-07-08 NOTE — Patient Instructions (Addendum)
-   Finish out remaining physical therapy sessions, have them update your home exercises say that you can advance to a fully home-based program - You can stop using the ankle brace, reserve usage only for times of more severe ankle pain when symptoms do not respond to rest, elevation, ice, or medications - Can dose anti-inflammatory pain medication diclofenac up to twice a day on an as-needed basis for pain/swelling - Continue to work with work restrictions note, restrictions x6 weeks - Return for follow-up in 6 weeks

## 2022-07-08 NOTE — Assessment & Plan Note (Signed)
Currently asymptomatic, encouraged continued activities as tolerated without restrictions.

## 2022-07-09 ENCOUNTER — Ambulatory Visit: Payer: BC Managed Care – PPO | Admitting: Physical Therapy

## 2022-07-09 DIAGNOSIS — M25572 Pain in left ankle and joints of left foot: Secondary | ICD-10-CM

## 2022-07-09 DIAGNOSIS — M25511 Pain in right shoulder: Secondary | ICD-10-CM

## 2022-07-09 DIAGNOSIS — M6281 Muscle weakness (generalized): Secondary | ICD-10-CM

## 2022-07-09 DIAGNOSIS — R262 Difficulty in walking, not elsewhere classified: Secondary | ICD-10-CM | POA: Diagnosis not present
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
## 2022-07-14 ENCOUNTER — Ambulatory Visit: Payer: BC Managed Care – PPO | Admitting: Physical Therapy

## 2022-07-14 DIAGNOSIS — M25572 Pain in left ankle and joints of left foot: Secondary | ICD-10-CM | POA: Diagnosis not present

## 2022-07-14 DIAGNOSIS — R262 Difficulty in walking, not elsewhere classified: Secondary | ICD-10-CM

## 2022-07-14 DIAGNOSIS — M25511 Pain in right shoulder: Secondary | ICD-10-CM

## 2022-07-14 DIAGNOSIS — M6281 Muscle weakness (generalized): Secondary | ICD-10-CM
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
## 2022-07-16 ENCOUNTER — Encounter: Payer: Self-pay | Admitting: Physical Therapy

## 2022-07-16 ENCOUNTER — Ambulatory Visit: Payer: BC Managed Care – PPO | Admitting: Physical Therapy

## 2022-07-16 DIAGNOSIS — R262 Difficulty in walking, not elsewhere classified: Secondary | ICD-10-CM

## 2022-07-16 DIAGNOSIS — M25572 Pain in left ankle and joints of left foot: Secondary | ICD-10-CM | POA: Diagnosis not present

## 2022-07-16 DIAGNOSIS — M6281 Muscle weakness (generalized): Secondary | ICD-10-CM | POA: Diagnosis not present

## 2022-07-16 DIAGNOSIS — M25511 Pain in right shoulder: Secondary | ICD-10-CM
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
## 2022-07-22 ENCOUNTER — Ambulatory Visit: Payer: BC Managed Care – PPO | Attending: Family Medicine | Admitting: Physical Therapy

## 2022-07-22 ENCOUNTER — Encounter: Payer: Self-pay | Admitting: Physical Therapy

## 2022-07-22 DIAGNOSIS — M25572 Pain in left ankle and joints of left foot: Secondary | ICD-10-CM | POA: Insufficient documentation

## 2022-07-22 DIAGNOSIS — R262 Difficulty in walking, not elsewhere classified: Secondary | ICD-10-CM | POA: Insufficient documentation

## 2022-07-22 DIAGNOSIS — M6281 Muscle weakness (generalized): Secondary | ICD-10-CM | POA: Diagnosis not present

## 2022-07-22 DIAGNOSIS — M25511 Pain in right shoulder: Secondary | ICD-10-CM | POA: Diagnosis not present
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
## 2022-07-30 ENCOUNTER — Encounter: Payer: Self-pay | Admitting: Physical Therapy

## 2022-07-30 ENCOUNTER — Ambulatory Visit: Payer: BC Managed Care – PPO | Admitting: Physical Therapy

## 2022-07-30 DIAGNOSIS — M25572 Pain in left ankle and joints of left foot: Secondary | ICD-10-CM | POA: Diagnosis not present

## 2022-07-30 DIAGNOSIS — M25511 Pain in right shoulder: Secondary | ICD-10-CM

## 2022-07-30 DIAGNOSIS — M6281 Muscle weakness (generalized): Secondary | ICD-10-CM

## 2022-07-30 DIAGNOSIS — R262 Difficulty in walking, not elsewhere classified: Secondary | ICD-10-CM | POA: Diagnosis not present
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
## 2022-08-06 ENCOUNTER — Ambulatory Visit: Payer: BC Managed Care – PPO | Admitting: Physical Therapy

## 2022-08-06 DIAGNOSIS — M6281 Muscle weakness (generalized): Secondary | ICD-10-CM

## 2022-08-06 DIAGNOSIS — M25511 Pain in right shoulder: Secondary | ICD-10-CM

## 2022-08-06 DIAGNOSIS — M25572 Pain in left ankle and joints of left foot: Secondary | ICD-10-CM | POA: Diagnosis not present

## 2022-08-06 DIAGNOSIS — R262 Difficulty in walking, not elsewhere classified: Secondary | ICD-10-CM
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
## 2022-08-07 ENCOUNTER — Encounter: Payer: Self-pay | Admitting: Physical Therapy

## 2022-08-13 ENCOUNTER — Ambulatory Visit: Payer: BC Managed Care – PPO

## 2022-08-13 DIAGNOSIS — M25511 Pain in right shoulder: Secondary | ICD-10-CM

## 2022-08-13 DIAGNOSIS — M6281 Muscle weakness (generalized): Secondary | ICD-10-CM | POA: Diagnosis not present

## 2022-08-13 DIAGNOSIS — M25572 Pain in left ankle and joints of left foot: Secondary | ICD-10-CM | POA: Diagnosis not present

## 2022-08-13 DIAGNOSIS — R262 Difficulty in walking, not elsewhere classified: Secondary | ICD-10-CM | POA: Diagnosis not present
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
## 2022-08-19 ENCOUNTER — Ambulatory Visit: Payer: BC Managed Care – PPO | Admitting: Family Medicine

## 2022-08-20 ENCOUNTER — Encounter: Payer: Self-pay | Admitting: Family Medicine

## 2022-08-20 ENCOUNTER — Ambulatory Visit (INDEPENDENT_AMBULATORY_CARE_PROVIDER_SITE_OTHER): Payer: BC Managed Care – PPO | Admitting: Family Medicine

## 2022-08-20 VITALS — BP 118/64 | HR 78 | Ht 67.0 in | Wt 161.0 lb

## 2022-08-20 DIAGNOSIS — M2141 Flat foot [pes planus] (acquired), right foot: Secondary | ICD-10-CM

## 2022-08-20 DIAGNOSIS — M2142 Flat foot [pes planus] (acquired), left foot: Secondary | ICD-10-CM

## 2022-08-20 DIAGNOSIS — S46001D Unspecified injury of muscle(s) and tendon(s) of the rotator cuff of right shoulder, subsequent encounter: Secondary | ICD-10-CM | POA: Diagnosis not present

## 2022-08-20 DIAGNOSIS — M2140 Flat foot [pes planus] (acquired), unspecified foot: Secondary | ICD-10-CM | POA: Insufficient documentation

## 2022-08-20 DIAGNOSIS — M76822 Posterior tibial tendinitis, left leg: Secondary | ICD-10-CM

## 2022-08-20 NOTE — Assessment & Plan Note (Signed)
Date of injury 05/03/2022, this issue remains asymptomatic.

## 2022-08-20 NOTE — Patient Instructions (Signed)
-   Can return to work without restrictions - Continue with home exercises until symptoms fully resolve - Consider arch support (orthotics), i.e. [employer] brand - If using arch supports, gradually wear them more and more to allow time for your feet to adjust to them - Contact us to provide a status update in 2 months or anytime for symptoms between now and then - Follow-up as needed

## 2022-08-20 NOTE — Progress Notes (Signed)
     Primary Care / Sports Medicine Office Visit  Patient Information:  Patient ID: Shane Ortiz, male DOB: 1996/10/02 Age: 26 y.o. MRN: [id number]   Shane Ortiz is a pleasant 26 y.o. male presenting with the following:  Chief Complaint  Patient presents with   right rotator cuff    Feels like everything is going good   Ankle Pain    Vitals:   08/20/22 0811  BP: 118/64  Pulse: 78  SpO2: 99%   Vitals:   08/20/22 0811  Weight: 161 lb (73 kg)  Height: 5\' 7"  (1.702 m)   Body mass index is 25.22 kg/m.  No results found.   Independent interpretation of notes and tests performed by another provider:   None  Procedures performed:   None  Pertinent History, Exam, Impression, and Recommendations:   Problem List Items Addressed This Visit       Musculoskeletal and Integument   Injury of tendon of right rotator cuff    Date of injury 05/03/2022, this issue remains asymptomatic.      Posterior tibial tendinitis, left - Primary    Date of injury 05/03/2022. Symptoms have essentially resolved, completed physical therapy and has been transition to a fully home-based program, has been able to perform increasing levels of activity without issues, not requiring ankle support brace or medications for pain control.  From a medication standpoint, I have advised him to discontinue any prescription medications, he can return to work without restrictions, contact us in 2 months to provide a status update or sooner if needed, and follow-up as needed as well.        Other   Flat foot    Pes planus at baseline, chronic condition, controlled, noted bilaterally.  I discussed the relation of his current symptoms, injury, and pes planus.  OTC orthotics discussed and appropriate gradual ramp up for usage.  He can contact us for any questions and follow-up as needed for this issue.        Orders & Medications No orders of the defined types were placed in this encounter.  No orders of the  defined types were placed in this encounter.    Return if symptoms worsen or fail to improve.     Montel Culver, MD   Primary Care Sports Medicine [place]

## 2022-08-20 NOTE — Assessment & Plan Note (Addendum)
Date of injury 05/03/2022. Symptoms have essentially resolved, completed physical therapy and has been transition to a fully home-based program, has been able to perform increasing levels of activity without issues, not requiring ankle support brace or medications for pain control.  From a medication standpoint, I have advised him to discontinue any prescription medications, he can return to work without restrictions, contact us in 2 months to provide a status update or sooner if needed, and follow-up as needed as well.

## 2022-08-20 NOTE — Assessment & Plan Note (Signed)
Pes planus at baseline, chronic condition, controlled, noted bilaterally.  I discussed the relation of his current symptoms, injury, and pes planus.  OTC orthotics discussed and appropriate gradual ramp up for usage.  He can contact us for any questions and follow-up as needed for this issue.

## 2022-08-29 DIAGNOSIS — Z23 Encounter for immunization: Secondary | ICD-10-CM | POA: Diagnosis not present
